# Patient Record
Sex: Female | Born: 1970 | Race: White | Hispanic: No | Marital: Married | Smoking: Never smoker
Health system: Southern US, Community
[De-identification: ages and names within clinical notes are randomized; demographics above are authoritative.]

## PROBLEM LIST (undated history)

## (undated) DIAGNOSIS — T7840XA Allergy, unspecified, initial encounter: Secondary | ICD-10-CM

## (undated) DIAGNOSIS — E785 Hyperlipidemia, unspecified: Secondary | ICD-10-CM

## (undated) DIAGNOSIS — C801 Malignant (primary) neoplasm, unspecified: Secondary | ICD-10-CM

## (undated) HISTORY — DX: Allergy, unspecified, initial encounter: T78.40XA

## (undated) HISTORY — DX: Malignant (primary) neoplasm, unspecified: C80.1

## (undated) HISTORY — DX: Hyperlipidemia, unspecified: E78.5

## (undated) HISTORY — PX: ABDOMINAL HYSTERECTOMY: SHX81

## (undated) HISTORY — PX: APPENDECTOMY: SHX54

---

## 2020-01-09 ENCOUNTER — Ambulatory Visit: Payer: Self-pay | Admitting: Adult Health

## 2020-01-18 ENCOUNTER — Encounter: Payer: Self-pay | Admitting: Adult Health

## 2020-01-18 ENCOUNTER — Ambulatory Visit (INDEPENDENT_AMBULATORY_CARE_PROVIDER_SITE_OTHER): Payer: PRIVATE HEALTH INSURANCE | Admitting: Adult Health

## 2020-01-18 ENCOUNTER — Other Ambulatory Visit: Payer: Self-pay

## 2020-01-18 VITALS — BP 98/69 | HR 75 | Temp 97.7°F | Resp 16 | Ht 67.0 in | Wt 147.2 lb

## 2020-01-18 DIAGNOSIS — Z87448 Personal history of other diseases of urinary system: Secondary | ICD-10-CM | POA: Diagnosis not present

## 2020-01-18 DIAGNOSIS — Z8669 Personal history of other diseases of the nervous system and sense organs: Secondary | ICD-10-CM | POA: Insufficient documentation

## 2020-01-18 DIAGNOSIS — E785 Hyperlipidemia, unspecified: Secondary | ICD-10-CM | POA: Diagnosis not present

## 2020-01-18 DIAGNOSIS — Z111 Encounter for screening for respiratory tuberculosis: Secondary | ICD-10-CM | POA: Insufficient documentation

## 2020-01-18 DIAGNOSIS — Z1389 Encounter for screening for other disorder: Secondary | ICD-10-CM | POA: Diagnosis not present

## 2020-01-18 DIAGNOSIS — R928 Other abnormal and inconclusive findings on diagnostic imaging of breast: Secondary | ICD-10-CM | POA: Insufficient documentation

## 2020-01-18 DIAGNOSIS — R319 Hematuria, unspecified: Secondary | ICD-10-CM

## 2020-01-18 DIAGNOSIS — Z90711 Acquired absence of uterus with remaining cervical stump: Secondary | ICD-10-CM | POA: Insufficient documentation

## 2020-01-18 DIAGNOSIS — Z8541 Personal history of malignant neoplasm of cervix uteri: Secondary | ICD-10-CM | POA: Insufficient documentation

## 2020-01-18 LAB — POCT URINALYSIS DIPSTICK
Bilirubin, UA: NEGATIVE
Glucose, UA: NEGATIVE
Ketones, UA: NEGATIVE
Leukocytes, UA: NEGATIVE
Nitrite, UA: NEGATIVE
Protein, UA: POSITIVE — AB
Spec Grav, UA: 1.01 (ref 1.010–1.025)
Urobilinogen, UA: 0.2 E.U./dL
pH, UA: 5 (ref 5.0–8.0)

## 2020-01-18 NOTE — Progress Notes (Signed)
Positive protein, moderate blood. Do recommend urology and microscopic urine test. She reported history of hematuria chronic.

## 2020-01-18 NOTE — Patient Instructions (Signed)
Health Maintenance, Female Adopting a healthy lifestyle and getting preventive care are important in promoting health and wellness. Ask your health care provider about:  The right schedule for you to have regular tests and exams.  Things you can do on your own to prevent diseases and keep yourself healthy. What should I know about diet, weight, and exercise? Eat a healthy diet   Eat a diet that includes plenty of vegetables, fruits, low-fat dairy products, and lean protein.  Do not eat a lot of foods that are high in solid fats, added sugars, or sodium. Maintain a healthy weight Body mass index (BMI) is used to identify weight problems. It estimates body fat based on height and weight. Your health care provider can help determine your BMI and help you achieve or maintain a healthy weight. Get regular exercise Get regular exercise. This is one of the most important things you can do for your health. Most adults should:  Exercise for at least 150 minutes each week. The exercise should increase your heart rate and make you sweat (moderate-intensity exercise).  Do strengthening exercises at least twice a week. This is in addition to the moderate-intensity exercise.  Spend less time sitting. Even light physical activity can be beneficial. Watch cholesterol and blood lipids Have your blood tested for lipids and cholesterol at 48 years of age, then have this test every 5 years. Have your cholesterol levels checked more often if:  Your lipid or cholesterol levels are high.  You are older than 48 years of age.  You are at high risk for heart disease. What should I know about cancer screening? Depending on your health history and family history, you may need to have cancer screening at various ages. This may include screening for:  Breast cancer.  Cervical cancer.  Colorectal cancer.  Skin cancer.  Lung cancer. What should I know about heart disease, diabetes, and high blood  pressure? Blood pressure and heart disease  High blood pressure causes heart disease and increases the risk of stroke. This is more likely to develop in people who have high blood pressure readings, are of African descent, or are overweight.  Have your blood pressure checked: ? Every 3-5 years if you are 18-39 years of age. ? Every year if you are 40 years old or older. Diabetes Have regular diabetes screenings. This checks your fasting blood sugar level. Have the screening done:  Once every three years after age 40 if you are at a normal weight and have a low risk for diabetes.  More often and at a younger age if you are overweight or have a high risk for diabetes. What should I know about preventing infection? Hepatitis B If you have a higher risk for hepatitis B, you should be screened for this virus. Talk with your health care provider to find out if you are at risk for hepatitis B infection. Hepatitis C Testing is recommended for:  Everyone born from 1945 through 1965.  Anyone with known risk factors for hepatitis C. Sexually transmitted infections (STIs)  Get screened for STIs, including gonorrhea and chlamydia, if: ? You are sexually active and are younger than 48 years of age. ? You are older than 48 years of age and your health care provider tells you that you are at risk for this type of infection. ? Your sexual activity has changed since you were last screened, and you are at increased risk for chlamydia or gonorrhea. Ask your health care provider if   you are at risk.  Ask your health care provider about whether you are at high risk for HIV. Your health care provider may recommend a prescription medicine to help prevent HIV infection. If you choose to take medicine to prevent HIV, you should first get tested for HIV. You should then be tested every 3 months for as long as you are taking the medicine. Pregnancy  If you are about to stop having your period (premenopausal) and  you may become pregnant, seek counseling before you get pregnant.  Take 400 to 800 micrograms (mcg) of folic acid every day if you become pregnant.  Ask for birth control (contraception) if you want to prevent pregnancy. Osteoporosis and menopause Osteoporosis is a disease in which the bones lose minerals and strength with aging. This can result in bone fractures. If you are 65 years old or older, or if you are at risk for osteoporosis and fractures, ask your health care provider if you should:  Be screened for bone loss.  Take a calcium or vitamin D supplement to lower your risk of fractures.  Be given hormone replacement therapy (HRT) to treat symptoms of menopause. Follow these instructions at home: Lifestyle  Do not use any products that contain nicotine or tobacco, such as cigarettes, e-cigarettes, and chewing tobacco. If you need help quitting, ask your health care provider.  Do not use street drugs.  Do not share needles.  Ask your health care provider for help if you need support or information about quitting drugs. Alcohol use  Do not drink alcohol if: ? Your health care provider tells you not to drink. ? You are pregnant, may be pregnant, or are planning to become pregnant.  If you drink alcohol: ? Limit how much you use to 0-1 drink a day. ? Limit intake if you are breastfeeding.  Be aware of how much alcohol is in your drink. In the [place] one drink equals one 12 oz bottle of beer (355 mL), one 5 oz glass of wine (148 mL), or one 1 oz glass of hard liquor (44 mL). General instructions  Schedule regular health, dental, and eye exams.  Stay current with your vaccines.  Tell your health care provider if: ? You often feel depressed. ? You have ever been abused or do not feel safe at home. Summary  Adopting a healthy lifestyle and getting preventive care are important in promoting health and wellness.  Follow your health care provider's instructions about healthy  diet, exercising, and getting tested or screened for diseases.  Follow your health care provider's instructions on monitoring your cholesterol and blood pressure. This information is not intended to replace advice given to you by your health care provider. Make sure you discuss any questions you have with your health care provider. Document Revised: 12/30/2017 Document Reviewed: 12/30/2017 Elsevier Patient Education  2020 Elsevier Inc.  

## 2020-01-18 NOTE — Progress Notes (Signed)
New patient visit   Patient: Marisa Trevino   DOB: 1970/07/19   49 y.o. Female  MRN: [id number] Visit Date: 01/18/2020  Today's healthcare provider: Jairo Ben, FNP   Chief Complaint  Patient presents with  . New Patient (Initial Visit)   Subjective    Marisa Trevino is a 49 y.o. female who presents today as a new patient to establish care.  HPI  Patient reports that she feels well today and has no concerns to address. Patient states that she follows a well balanced diet and is exercising daily. She states due to menopause sleep patterns are poor.   August 24th  2021.  She had labs see media. See abnormal mammogram under media, is over due for repeat mammogram diagnostic declined at this time. Overdue for colonoscopy, declined at  This time. Denies any breast changes, or nipple discharge or skin changes.   History of partial hysterectomy, has had cervical cancer, ovaries remain.   History of migraines for many years per patient controlled with rizatriptan and verapamil. Denies any new or changing symptoms. No recent migraines. No head trauma or injury.   History of microscopic hematuria chronic she reports has been worked up in past and negative, desires no further work up at  This time.   denies any mental health treatment.  Denies any drug or smoking use.  She does use occasional social alcohol.  Working on hyperlipidemia with exercise and dietary changes.   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   denies any homicidal or suicidal ideations or intents now or in the past. She is covid vaccinated.   Patient  denies any fever, body aches,chills, rash, chest pain, shortness of breath, nausea, vomiting, or diarrhea.   Denies dizziness, lightheadedness, pre syncopal or syncopal episodes.    Past Medical History:  Diagnosis Date  . Allergy   . Cancer (HCC)   . Hyperlipidemia    Past Surgical History:  Procedure Laterality Date   . ABDOMINAL HYSTERECTOMY    . APPENDECTOMY     Family Status  Relation Name Status  . Mother  (Not Specified)  . Father  (Not Specified)  . MGF  (Not Specified)   Family History  Problem Relation Age of Onset  . Depression Mother   . Cancer Father   . Heart disease Maternal Grandfather    Social History   Socioeconomic History  . Marital status: Married    Spouse name: Not on file  . Number of children: Not on file  . Years of education: Not on file  . Highest education level: Not on file  Occupational History  . Not on file  Tobacco Use  . Smoking status: Never Smoker  . Smokeless tobacco: Never Used  Substance and Sexual Activity  . Alcohol use: Yes    Alcohol/week: 5.0 standard drinks    Types: 5 Cans of beer per week  . Drug use: Never  . Sexual activity: Not on file  Other Topics Concern  . Not on file  Social History Narrative  . Not on file   Social Determinants of Health   Financial Resource Strain: Not on file  Food Insecurity: Not on file  Transportation Needs: Not on file  Physical Activity: Not on file  Stress: Not on file  Social Connections: Not on file   Outpatient Medications Prior to Visit  Medication Sig  . APPLE CIDER VINEGAR PO Take by mouth 2 (two) times daily.  Marland Kitchen  COLLAGEN PO Take by mouth daily at 6 (six) AM.  . Multiple Vitamin (MULTIVITAMIN) tablet Take 1 tablet by mouth daily.  . NON FORMULARY daily. Metabolism booster  . Omega-3 Fatty Acids (FISH OIL PO) Take by mouth.  Marland Kitchen OVER THE COUNTER MEDICATION 500 mg daily at 6 (six) AM. Fiber  . rizatriptan (MAXALT) 10 MG tablet Take 10 mg by mouth as needed for migraine. May repeat in 2 hours if needed  . Turmeric (QC TUMERIC COMPLEX) 500 MG CAPS Take by mouth.  . verapamil (VERELAN PM) 120 MG 24 hr capsule Take by mouth.  . vitamin E 180 MG (400 UNITS) capsule Take 400 Units by mouth 2 (two) times daily.   No facility-administered medications prior to visit.   Allergies  Allergen  Reactions  . Sulfamethoxazole-Trimethoprim Hives  . Sulfa Antibiotics Hives  . Penicillins Rash    Other reaction(s): Unknown    Immunization History  Administered Date(s) Administered  . Influenza-Unspecified 10/29/2010    Health Maintenance  Topic Date Due  . Hepatitis C Screening  Never done  . COVID-19 Vaccine (1) Never done  . HIV Screening  Never done  . TETANUS/TDAP  Never done  . INFLUENZA VACCINE  08/21/2019  . COLONOSCOPY (Pts 45-35yrs Insurance coverage will need to be confirmed)  01/17/2021 (Originally 06/19/2016)  . PAP SMEAR-Modifier  Discontinued    Patient Care Team: Jamespaul Secrist, Kelby Aline, FNP as PCP - General (Family Medicine)  Review of Systems  Constitutional: Negative.   HENT: Negative.   Respiratory: Negative.   Cardiovascular: Negative.   Gastrointestinal: Negative.   Genitourinary: Positive for hematuria (history of microscopic declines further work up at this time. ). Negative for difficulty urinating.  Musculoskeletal: Negative.   Allergic/Immunologic: Positive for food allergies. Negative for environmental allergies and immunocompromised state.  Neurological: Positive for headaches. Negative for dizziness, tremors, seizures, syncope, facial asymmetry, speech difficulty, weakness, light-headedness and numbness.  Psychiatric/Behavioral: Negative.   All other systems reviewed and are negative.    see media.   Objective    BP 98/69   Pulse 75   Temp 97.7 F (36.5 C) (Oral)   Resp 16   Ht 5\' 7"  (1.702 m)   Wt 147 lb 3.2 oz (66.8 kg)   SpO2 100%   BMI 23.05 kg/m  Physical Exam Vitals and nursing note reviewed.  Constitutional:      General: She is not in acute distress.    Appearance: She is well-developed. She is not diaphoretic.     Interventions: She is not intubated.    Comments: Patient appers well, not sickly. Speaking in complete sentences. Patient moves on and off of exam table and in room without difficulty. Gait is normal in hall  and in room. Patient is oriented to person place time and situation. Patient answers questions appropriately and engages eye contact and verbal dialect with provider.    HENT:     Head: Normocephalic and atraumatic.     Right Ear: External ear normal.     Left Ear: External ear normal.     Nose: Nose normal.     Mouth/Throat:     Pharynx: No oropharyngeal exudate.  Eyes:     General: Lids are normal. No scleral icterus.       Right eye: No discharge.        Left eye: No discharge.     Conjunctiva/sclera: Conjunctivae normal.     Right eye: Right conjunctiva is not injected. No exudate or hemorrhage.  Left eye: Left conjunctiva is not injected. No exudate or hemorrhage.    Pupils: Pupils are equal, round, and reactive to light.  Neck:     Thyroid: No thyroid mass or thyromegaly.     Vascular: Normal carotid pulses. No carotid bruit, hepatojugular reflux or JVD.     Trachea: Trachea and phonation normal. No tracheal tenderness or tracheal deviation.     Meningeal: Brudzinski's sign and Kernig's sign absent.  Cardiovascular:     Rate and Rhythm: Normal rate and regular rhythm.     Pulses: Normal pulses.          Radial pulses are 2+ on the right side and 2+ on the left side.       Dorsalis pedis pulses are 2+ on the right side and 2+ on the left side.       Posterior tibial pulses are 2+ on the right side and 2+ on the left side.     Heart sounds: Normal heart sounds, S1 normal and S2 normal. Heart sounds not distant. No murmur heard. No friction rub. No gallop.   Pulmonary:     Effort: Pulmonary effort is normal. No tachypnea, bradypnea, accessory muscle usage or respiratory distress. She is not intubated.     Breath sounds: Normal breath sounds. No stridor. No wheezing or rales.  Chest:     Chest wall: No tenderness.  Breasts:     Right: No supraclavicular adenopathy.     Left: No supraclavicular adenopathy.    Abdominal:     General: Bowel sounds are normal. There is no  distension or abdominal bruit.     Palpations: Abdomen is soft. There is no shifting dullness, fluid wave, hepatomegaly, splenomegaly, mass or pulsatile mass.     Tenderness: There is no abdominal tenderness. There is no right CVA tenderness, left CVA tenderness, guarding or rebound.     Hernia: No hernia is present.  Genitourinary:    Comments: Declined deferred.  Musculoskeletal:        General: No tenderness or deformity. Normal range of motion.     Cervical back: Full passive range of motion without pain, normal range of motion and neck supple. No edema, erythema or rigidity. No spinous process tenderness or muscular tenderness. Normal range of motion.  Lymphadenopathy:     Head:     Right side of head: No submental, submandibular, tonsillar, preauricular, posterior auricular or occipital adenopathy.     Left side of head: No submental, submandibular, tonsillar, preauricular, posterior auricular or occipital adenopathy.     Cervical: No cervical adenopathy.     Right cervical: No superficial, deep or posterior cervical adenopathy.    Left cervical: No superficial, deep or posterior cervical adenopathy.     Upper Body:     Right upper body: No supraclavicular or pectoral adenopathy.     Left upper body: No supraclavicular or pectoral adenopathy.  Skin:    General: Skin is warm and dry.     Coloration: Skin is not pale.     Findings: No abrasion, bruising, burn, ecchymosis, erythema, lesion, petechiae or rash.     Nails: There is no clubbing.  Neurological:     Mental Status: She is alert and oriented to person, place, and time.     GCS: GCS eye subscore is 4. GCS verbal subscore is 5. GCS motor subscore is 6.     Cranial Nerves: No cranial nerve deficit.     Sensory: No sensory deficit.  Motor: No weakness, tremor, atrophy, abnormal muscle tone or seizure activity.     Coordination: Coordination normal.     Gait: Gait normal.     Deep Tendon Reflexes: Reflexes are normal and  symmetric. Reflexes normal. Babinski sign absent on the right side. Babinski sign absent on the left side.     Reflex Scores:      Tricep reflexes are 2+ on the right side and 2+ on the left side.      Bicep reflexes are 2+ on the right side and 2+ on the left side.      Brachioradialis reflexes are 2+ on the right side and 2+ on the left side.      Patellar reflexes are 2+ on the right side and 2+ on the left side.      Achilles reflexes are 2+ on the right side and 2+ on the left side. Psychiatric:        Mood and Affect: Mood normal.        Speech: Speech normal.        Behavior: Behavior normal.        Thought Content: Thought content normal.        Judgment: Judgment normal.     Depression Screen No flowsheet data found. No results found for any visits on 01/18/20.  Assessment & Plan       Hyperlipidemia, unspecified hyperlipidemia type  History of migraine headaches  Screening for blood or protein in urine - Plan: POCT urinalysis dipstick  History of hematuria  History of partial hysterectomy- has both ovaries.   Screening for tuberculosis - Plan: QuantiFERON-TB Gold Plus  Abnormal mammogram  History of cervical cancer   No orders of the defined types were placed in this encounter.  Recommend mammogram diagnostic per last mammogram see media, and colonoscopy. Patient declines and will return to office at later date to discuss she reports.  Risks versus benefits discussed. Patient verbalized understanding of all instructions given and denies any further questions at this time.   Red Flags discussed. The patient was given clear instructions to go to ER or return to medical center if any red flags develop, symptoms do not improve, worsen or new problems develop. They verbalized understanding. Urology advised for hematuria she declines at this time. Risk versus benefits understood by patient.   Work form filled out and copy kept to scan to chart as well as labs to  scan to chart.  Orders Placed This Encounter  Procedures  . QuantiFERON-TB Gold Plus  . POCT urinalysis dipstick   Red Flags discussed. The patient was given clear instructions to go to ER or return to medical center if any red flags develop, symptoms do not improve, worsen or new problems develop. They verbalized understanding.  The patient is advised to begin progressive daily aerobic exercise program, follow a low fat, low cholesterol diet, reduce salt in diet and cooking, reduce exposure to stress, improve dietary compliance, continue current medications, continue current healthy lifestyle patterns and return for routine annual checkups.  Return in about 3 months (around 04/17/2020), or if symptoms worsen or fail to improve, for at any time for any worsening symptoms, Go to Emergency room/ urgent care if worse.     The entirety of the information documented in the History of Present Illness, Review of Systems and Physical Exam were personally obtained by me. Portions of this information were initially documented by the CMA and reviewed by me for thoroughness and accuracy.  Marcille Buffy, [place] [phone] (phone) [phone] (fax)  [place]

## 2020-01-19 LAB — URINALYSIS, MICROSCOPIC ONLY
Bacteria, UA: NONE SEEN
Casts: NONE SEEN /lpf
WBC, UA: NONE SEEN /hpf (ref 0–5)

## 2020-01-19 NOTE — Progress Notes (Signed)
Hematuria in microscopic , do recommend urologist referral since she reports this has been chronic and she has a history of cervical cancer.

## 2020-01-20 LAB — QUANTIFERON-TB GOLD PLUS
QuantiFERON Mitogen Value: >10 IU/mL
QuantiFERON Nil Value: 0.57 IU/mL
QuantiFERON TB1 Ag Value: 0.87 IU/mL
QuantiFERON TB2 Ag Value: 0.44 IU/mL
QuantiFERON-TB Gold Plus: NEGATIVE

## 2020-02-17 ENCOUNTER — Other Ambulatory Visit: Payer: Self-pay | Admitting: Adult Health

## 2020-02-17 ENCOUNTER — Encounter: Payer: Self-pay | Admitting: Adult Health

## 2020-02-17 MED ORDER — VERAPAMIL HCL ER 120 MG PO CP24
120.0000 mg | ORAL_CAPSULE | Freq: Every day | ORAL | 1 refills | Status: DC
Start: 2020-02-17 — End: 2020-02-22

## 2020-02-17 NOTE — Progress Notes (Signed)
Meds ordered this encounter  Medications  . verapamil (VERELAN PM) 120 MG 24 hr capsule    Sig: Take 1 capsule (120 mg total) by mouth at bedtime.    Dispense:  90 capsule    Refill:  1

## 2020-02-21 ENCOUNTER — Encounter: Payer: Self-pay | Admitting: Adult Health

## 2020-02-22 ENCOUNTER — Other Ambulatory Visit: Payer: Self-pay | Admitting: Adult Health

## 2020-02-22 DIAGNOSIS — Z8669 Personal history of other diseases of the nervous system and sense organs: Secondary | ICD-10-CM

## 2020-02-22 MED ORDER — VERAPAMIL HCL ER 120 MG PO TBCR: 120.0000 mg | EXTENDED_RELEASE_TABLET | Freq: Every day | ORAL | 1 refills | Status: DC

## 2020-02-22 NOTE — Progress Notes (Signed)
Meds ordered this encounter  Medications  . verapamil (CALAN-SR) 120 MG CR tablet    Sig: Take 1 tablet (120 mg total) by mouth at bedtime.    Dispense:  90 tablet    Refill:  1    Please cancel capsules previously sent not covered per patient.    Medications Discontinued During This Encounter  Medication Reason  . verapamil (VERELAN PM) 120 MG 24 hr capsule Not covered by the pt's insurance

## 2020-05-08 ENCOUNTER — Telehealth: Payer: Self-pay

## 2020-05-08 ENCOUNTER — Other Ambulatory Visit: Payer: Self-pay | Admitting: Adult Health

## 2020-05-08 DIAGNOSIS — R928 Other abnormal and inconclusive findings on diagnostic imaging of breast: Secondary | ICD-10-CM

## 2020-05-08 NOTE — Progress Notes (Signed)
Orders Placed This Encounter  Procedures  . MM 3D SCREEN BREAST BILATERAL  . US BREAST LTD UNI LEFT INC AXILLA  . US BREAST LTD UNI RIGHT INC AXILLA

## 2020-05-08 NOTE — Telephone Encounter (Signed)
Copied from [place] [id number]. Topic: General - Other >> May 08, 2020 12:23 PM Antonieta Iba C wrote: Reason for CRM: pt called in for assistance. Pt says that she will be due to have her yearly mammogram in July. Pt would like a referral. Pt says that she is new to the area and is not familiar with who provider would suggest.    Please assist.

## 2020-05-08 NOTE — Telephone Encounter (Signed)
Orders Placed This Encounter  Procedures   MM 3D SCREEN BREAST BILATERAL   US BREAST LTD UNI LEFT INC AXILLA   US BREAST LTD UNI RIGHT INC AXILLA  Previous mammogram under media, provider ordered the above test at [facility].   [facility] [place], [place] [address]  Main: [phone]

## 2020-05-17 ENCOUNTER — Inpatient Hospital Stay
Admission: RE | Admit: 2020-05-17 | Discharge: 2020-05-17 | Disposition: A | Discharge status: Home / Self Care | Payer: Self-pay | Source: Ambulatory Visit | Attending: *Deleted | Admitting: *Deleted

## 2020-05-17 ENCOUNTER — Other Ambulatory Visit: Payer: Self-pay | Admitting: *Deleted

## 2020-05-17 DIAGNOSIS — Z1231 Encounter for screening mammogram for malignant neoplasm of breast: Secondary | ICD-10-CM

## 2020-05-21 ENCOUNTER — Telehealth: Payer: Self-pay | Admitting: Adult Health

## 2020-05-21 NOTE — Telephone Encounter (Signed)
Patient called to request another referral for the [facility].  She stated that there was a referral put in by Dr. Claudina Lick but because she is no longer at the practice, the breast center said they would need a new referral from a current doctor there.  Please advise and call patient to discuss.  CB# [phone]

## 2020-05-21 NOTE — Telephone Encounter (Signed)
Please order screening mammogram

## 2020-05-23 ENCOUNTER — Other Ambulatory Visit: Payer: Self-pay | Admitting: *Deleted

## 2020-05-23 DIAGNOSIS — R928 Other abnormal and inconclusive findings on diagnostic imaging of breast: Secondary | ICD-10-CM

## 2020-05-23 NOTE — Telephone Encounter (Signed)
Pt stated she is having an issue so she would like to request that the referral be placed as soon as possible.

## 2020-05-23 NOTE — Progress Notes (Signed)
Re-ordered mammogram.   

## 2020-05-24 ENCOUNTER — Other Ambulatory Visit: Payer: Self-pay | Admitting: Family Medicine

## 2020-05-24 DIAGNOSIS — R921 Mammographic calcification found on diagnostic imaging of breast: Secondary | ICD-10-CM

## 2020-05-29 ENCOUNTER — Other Ambulatory Visit: Payer: Self-pay | Admitting: Family Medicine

## 2020-05-29 ENCOUNTER — Other Ambulatory Visit: Payer: Self-pay

## 2020-05-29 ENCOUNTER — Ambulatory Visit
Admission: RE | Admit: 2020-05-29 | Discharge: 2020-05-29 | Disposition: A | Discharge status: Home / Self Care | Payer: Managed Care, Other (non HMO) | Source: Ambulatory Visit | Attending: Family Medicine | Admitting: Family Medicine

## 2020-05-29 ENCOUNTER — Ambulatory Visit: Admission: RE | Admit: 2020-05-29 | Payer: Managed Care, Other (non HMO) | Source: Ambulatory Visit

## 2020-05-29 DIAGNOSIS — R921 Mammographic calcification found on diagnostic imaging of breast: Secondary | ICD-10-CM | POA: Diagnosis present

## 2020-05-29 DIAGNOSIS — N6310 Unspecified lump in the right breast, unspecified quadrant: Secondary | ICD-10-CM

## 2020-05-29 DIAGNOSIS — R928 Other abnormal and inconclusive findings on diagnostic imaging of breast: Secondary | ICD-10-CM

## 2020-05-30 ENCOUNTER — Other Ambulatory Visit: Payer: Self-pay | Admitting: Family Medicine

## 2020-05-30 DIAGNOSIS — N6310 Unspecified lump in the right breast, unspecified quadrant: Secondary | ICD-10-CM

## 2020-05-30 DIAGNOSIS — R928 Other abnormal and inconclusive findings on diagnostic imaging of breast: Secondary | ICD-10-CM

## 2020-06-05 ENCOUNTER — Other Ambulatory Visit: Payer: PRIVATE HEALTH INSURANCE

## 2020-06-14 ENCOUNTER — Ambulatory Visit
Admission: RE | Admit: 2020-06-14 | Discharge: 2020-06-14 | Disposition: A | Discharge status: Home / Self Care | Payer: 59 | Source: Ambulatory Visit | Attending: Family Medicine | Admitting: Family Medicine

## 2020-06-14 ENCOUNTER — Other Ambulatory Visit: Payer: Self-pay

## 2020-06-14 DIAGNOSIS — R928 Other abnormal and inconclusive findings on diagnostic imaging of breast: Secondary | ICD-10-CM | POA: Diagnosis not present

## 2020-06-14 DIAGNOSIS — N6310 Unspecified lump in the right breast, unspecified quadrant: Secondary | ICD-10-CM

## 2020-06-14 HISTORY — PX: BREAST CYST ASPIRATION: SHX578

## 2020-07-25 ENCOUNTER — Telehealth: Payer: Self-pay

## 2020-07-25 NOTE — Telephone Encounter (Signed)
Copied from [place] [id number]. Topic: General - Other >> Jul 25, 2020 10:23 AM Marisa Trevino A wrote: Reason for CRM: Patient would like to have orders for labs submitted so that they can come in to have their blood drawn before their physical  Patient would like to discuss their lab results at their physical   Please contact to further advise

## 2020-07-25 NOTE — Telephone Encounter (Signed)
Way to early to get labs now. Probably should wait until the first of October.

## 2020-07-25 NOTE — Telephone Encounter (Signed)
Patient scheduled for 11/09/2020

## 2020-09-04 ENCOUNTER — Encounter: Payer: PRIVATE HEALTH INSURANCE | Admitting: Family Medicine

## 2020-09-21 ENCOUNTER — Other Ambulatory Visit: Payer: Self-pay | Admitting: Adult Health

## 2020-09-21 DIAGNOSIS — Z8669 Personal history of other diseases of the nervous system and sense organs: Secondary | ICD-10-CM

## 2020-11-05 ENCOUNTER — Telehealth: Payer: Self-pay

## 2020-11-05 NOTE — Telephone Encounter (Signed)
Copied from [place] [id number]. Topic: General - Inquiry >> Nov 05, 2020  9:58 AM Scherrie Gerlach wrote: Reason for CRM: pt states her insurance will pay for ROUTINE blood work.  Pt was to call in and let you know.  She would like to come in Wed morning for her labs if you could put the orders in.

## 2020-11-05 NOTE — Telephone Encounter (Signed)
Patient has a CPE scheduled 11/09/20 please advise if you want labs ordered prior to appt? KW

## 2020-11-06 ENCOUNTER — Other Ambulatory Visit: Payer: Self-pay | Admitting: Family Medicine

## 2020-11-06 DIAGNOSIS — Z131 Encounter for screening for diabetes mellitus: Secondary | ICD-10-CM

## 2020-11-06 DIAGNOSIS — Z13220 Encounter for screening for lipoid disorders: Secondary | ICD-10-CM | POA: Insufficient documentation

## 2020-11-06 DIAGNOSIS — N029 Recurrent and persistent hematuria with unspecified morphologic changes: Secondary | ICD-10-CM

## 2020-11-06 DIAGNOSIS — Z136 Encounter for screening for cardiovascular disorders: Secondary | ICD-10-CM | POA: Insufficient documentation

## 2020-11-06 DIAGNOSIS — R6889 Other general symptoms and signs: Secondary | ICD-10-CM | POA: Insufficient documentation

## 2020-11-06 DIAGNOSIS — G479 Sleep disorder, unspecified: Secondary | ICD-10-CM | POA: Insufficient documentation

## 2020-11-06 DIAGNOSIS — Z114 Encounter for screening for human immunodeficiency virus [HIV]: Secondary | ICD-10-CM

## 2020-11-06 DIAGNOSIS — Z1159 Encounter for screening for other viral diseases: Secondary | ICD-10-CM | POA: Insufficient documentation

## 2020-11-08 LAB — CBC WITH DIFFERENTIAL/PLATELET
Basophils Absolute: 0.1 10*3/uL (ref 0.0–0.2)
Basos: 1 %
EOS (ABSOLUTE): 0.1 10*3/uL (ref 0.0–0.4)
Eos: 2 %
Hematocrit: 38.6 % (ref 34.0–46.6)
Hemoglobin: 13.2 g/dL (ref 11.1–15.9)
Immature Grans (Abs): 0 10*3/uL (ref 0.0–0.1)
Immature Granulocytes: 0 %
Lymphocytes Absolute: 2 10*3/uL (ref 0.7–3.1)
Lymphs: 38 %
MCH: 31.7 pg (ref 26.6–33.0)
MCHC: 34.2 g/dL (ref 31.5–35.7)
MCV: 93 fL (ref 79–97)
Monocytes Absolute: 0.4 10*3/uL (ref 0.1–0.9)
Monocytes: 7 %
Neutrophils Absolute: 2.7 10*3/uL (ref 1.4–7.0)
Neutrophils: 52 %
Platelets: 246 10*3/uL (ref 150–450)
RBC: 4.16 x10E6/uL (ref 3.77–5.28)
RDW: 13 % (ref 11.7–15.4)
WBC: 5.2 10*3/uL (ref 3.4–10.8)

## 2020-11-08 LAB — COMPREHENSIVE METABOLIC PANEL
ALT: 19 IU/L (ref 0–32)
AST: 24 IU/L (ref 0–40)
Albumin/Globulin Ratio: 2.3 — ABNORMAL HIGH (ref 1.2–2.2)
Albumin: 5 g/dL — ABNORMAL HIGH (ref 3.8–4.8)
Alkaline Phosphatase: 69 IU/L (ref 44–121)
BUN/Creatinine Ratio: 34 — ABNORMAL HIGH (ref 9–23)
BUN: 26 mg/dL — ABNORMAL HIGH (ref 6–24)
Bilirubin Total: 0.4 mg/dL (ref 0.0–1.2)
CO2: 24 mmol/L (ref 20–29)
Calcium: 10 mg/dL (ref 8.7–10.2)
Chloride: 99 mmol/L (ref 96–106)
Creatinine, Ser: 0.76 mg/dL (ref 0.57–1.00)
Globulin, Total: 2.2 g/dL (ref 1.5–4.5)
Glucose: 89 mg/dL (ref 70–99)
Potassium: 4.3 mmol/L (ref 3.5–5.2)
Sodium: 138 mmol/L (ref 134–144)
Total Protein: 7.2 g/dL (ref 6.0–8.5)
eGFR: 96 mL/min/{1.73_m2} (ref >59)

## 2020-11-08 LAB — LIPID PANEL
Chol/HDL Ratio: 3.5 ratio (ref 0.0–4.4)
Cholesterol, Total: 262 mg/dL — ABNORMAL HIGH (ref 100–199)
HDL: 75 mg/dL (ref >39)
LDL Chol Calc (NIH): 178 mg/dL — ABNORMAL HIGH (ref 0–99)
Triglycerides: 57 mg/dL (ref 0–149)
VLDL Cholesterol Cal: 9 mg/dL (ref 5–40)

## 2020-11-08 LAB — HEMOGLOBIN A1C
Est. average glucose Bld gHb Est-mCnc: 105 mg/dL
Hgb A1c MFr Bld: 5.3 % (ref 4.8–5.6)

## 2020-11-08 LAB — HIV ANTIBODY (ROUTINE TESTING W REFLEX): HIV Screen 4th Generation wRfx: NONREACTIVE

## 2020-11-08 LAB — TSH: TSH: 0.68 u[IU]/mL (ref 0.450–4.500)

## 2020-11-08 LAB — HEPATITIS C ANTIBODY: Hep C Virus Ab: <0.1 s/co ratio (ref 0.0–0.9)

## 2020-11-08 LAB — T4, FREE: Free T4: 1.13 ng/dL (ref 0.82–1.77)

## 2020-11-09 ENCOUNTER — Encounter: Payer: PRIVATE HEALTH INSURANCE | Admitting: Family Medicine

## 2020-11-09 ENCOUNTER — Other Ambulatory Visit: Payer: Self-pay

## 2020-11-09 ENCOUNTER — Ambulatory Visit (INDEPENDENT_AMBULATORY_CARE_PROVIDER_SITE_OTHER): Payer: PRIVATE HEALTH INSURANCE | Admitting: Family Medicine

## 2020-11-09 ENCOUNTER — Encounter: Payer: Self-pay | Admitting: Family Medicine

## 2020-11-09 VITALS — BP 96/64 | HR 67 | Temp 98.5°F | Ht 67.0 in | Wt 144.5 lb

## 2020-11-09 DIAGNOSIS — Z23 Encounter for immunization: Secondary | ICD-10-CM | POA: Diagnosis not present

## 2020-11-09 DIAGNOSIS — G43911 Migraine, unspecified, intractable, with status migrainosus: Secondary | ICD-10-CM | POA: Diagnosis not present

## 2020-11-09 DIAGNOSIS — Z91018 Allergy to other foods: Secondary | ICD-10-CM

## 2020-11-09 DIAGNOSIS — J301 Allergic rhinitis due to pollen: Secondary | ICD-10-CM

## 2020-11-09 DIAGNOSIS — E785 Hyperlipidemia, unspecified: Secondary | ICD-10-CM

## 2020-11-09 DIAGNOSIS — Z0000 Encounter for general adult medical examination without abnormal findings: Secondary | ICD-10-CM

## 2020-11-09 DIAGNOSIS — E7800 Pure hypercholesterolemia, unspecified: Secondary | ICD-10-CM

## 2020-11-09 NOTE — Progress Notes (Signed)
0    Complete physical exam   Patient: Marisa Trevino   DOB: Sep 17, 1970   50 y.o. Female  MRN: [id number] Visit Date: 11/09/2020  Today's healthcare provider: Gwyneth Sprout, FNP   Chief Complaint  Patient presents with   Annual Exam   Subjective    Marisa Trevino is a 50 y.o. female who presents today for a complete physical exam.  She reports consuming a general and clean  diet. The patient does not participate in regular exercise at present. She generally feels well. She reports sleeping well.  HPI   Past Medical History:  Diagnosis Date   Allergy    Cancer ([place])    Hyperlipidemia    Past Surgical History:  Procedure Laterality Date   ABDOMINAL HYSTERECTOMY     APPENDECTOMY     BREAST CYST ASPIRATION Right 06/14/2020   5:30 retroaerolar   BREAST CYST ASPIRATION Right 06/14/2020   6:00 retroaerolar   Social History   Socioeconomic History   Marital status: Married    Spouse name: Not on file   Number of children: Not on file   Years of education: Not on file   Highest education level: Not on file  Occupational History   Not on file  Tobacco Use   Smoking status: Never   Smokeless tobacco: Never  Substance and Sexual Activity   Alcohol use: Yes    Alcohol/week: 5.0 standard drinks    Types: 5 Cans of beer per week   Drug use: Never   Sexual activity: Not on file  Other Topics Concern   Not on file  Social History Narrative   Not on file   Social Determinants of Health   Financial Resource Strain: Not on file  Food Insecurity: Not on file  Transportation Needs: Not on file  Physical Activity: Not on file  Stress: Not on file  Social Connections: Not on file  Intimate Partner Violence: Not on file   Family Status  Relation Name Status   Mother  (Not Specified)   Father  (Not Specified)   MGF  (Not Specified)   Neg Hx  (Not Specified)   Family History  Problem Relation Age of Onset   Depression Mother    Cancer Father    Heart disease  Maternal Grandfather    Breast cancer Neg Hx    Allergies  Allergen Reactions   Sulfamethoxazole-Trimethoprim Hives   Sulfa Antibiotics Hives   Penicillins Rash    Other reaction(s): Unknown    Patient Care Team: Gwyneth Sprout, FNP as PCP - General (Family Medicine)   Medications: Outpatient Medications Prior to Visit  Medication Sig   APPLE CIDER VINEGAR PO Take by mouth 2 (two) times daily.   COLLAGEN PO Take by mouth daily at 6 (six) AM.   Multiple Vitamin (MULTIVITAMIN) tablet Take 1 tablet by mouth daily.   NON FORMULARY daily. Metabolism booster   Omega-3 Fatty Acids (FISH OIL PO) Take by mouth.   OVER THE COUNTER MEDICATION 500 mg daily at 6 (six) AM. Fiber   rizatriptan (MAXALT) 10 MG tablet Take 10 mg by mouth as needed for migraine. May repeat in 2 hours if needed   Turmeric 500 MG CAPS Take by mouth.   vitamin E 180 MG (400 UNITS) capsule Take 400 Units by mouth 2 (two) times daily.   [DISCONTINUED] verapamil (CALAN-SR) 120 MG CR tablet TAKE 1 TABLET BY MOUTH AT BEDTIME   No facility-administered medications prior to visit.  Review of Systems  HENT:  Positive for ear pain and voice change.   Allergic/Immunologic: Positive for environmental allergies and food allergies.  All other systems reviewed and are negative.  Is taking OTC medication to help with allergy symptoms Has stopped verapamil which she was taking for migraine prevention given dairy allergy    Objective    BP 96/64 (BP Location: Left Arm, Patient Position: Sitting, Cuff Size: Normal)   Pulse 67   Temp 98.5 F (36.9 C) (Oral)   Ht 5\' 7"  (1.702 m)   Wt 144 lb 8 oz (65.5 kg)   BMI 22.63 kg/m    Physical Exam Vitals and nursing note reviewed.  Constitutional:      General: She is awake. She is not in acute distress.    Appearance: Normal appearance. She is well-developed, well-groomed and normal weight. She is not ill-appearing, toxic-appearing or diaphoretic.  HENT:     Head:  Normocephalic and atraumatic.     Jaw: There is normal jaw occlusion. No trismus, tenderness, swelling or pain on movement.     Right Ear: Hearing, tympanic membrane, ear canal and external ear normal. There is no impacted cerumen.     Left Ear: Hearing, tympanic membrane, ear canal and external ear normal. There is no impacted cerumen.     Nose: Nose normal. No congestion or rhinorrhea.     Right Turbinates: Not enlarged, swollen or pale.     Left Turbinates: Not enlarged, swollen or pale.     Right Sinus: No maxillary sinus tenderness or frontal sinus tenderness.     Left Sinus: No maxillary sinus tenderness or frontal sinus tenderness.     Mouth/Throat:     Lips: Pink.     Mouth: Mucous membranes are moist. No injury.     Tongue: No lesions.     Pharynx: Oropharynx is clear. Uvula midline. No pharyngeal swelling, oropharyngeal exudate, posterior oropharyngeal erythema or uvula swelling.     Tonsils: No tonsillar exudate or tonsillar abscesses.  Eyes:     General: Lids are normal. Lids are everted, no foreign bodies appreciated. Vision grossly intact. Gaze aligned appropriately. No allergic shiner or visual field deficit.       Right eye: No discharge.        Left eye: No discharge.     Extraocular Movements: Extraocular movements intact.     Conjunctiva/sclera: Conjunctivae normal.     Right eye: Right conjunctiva is not injected. No exudate.    Left eye: Left conjunctiva is not injected. No exudate.    Pupils: Pupils are equal, round, and reactive to light.  Neck:     Thyroid: No thyroid mass, thyromegaly or thyroid tenderness.     Vascular: No carotid bruit.     Trachea: Trachea normal.  Cardiovascular:     Rate and Rhythm: Normal rate and regular rhythm.     Pulses: Normal pulses.          Carotid pulses are 2+ on the right side and 2+ on the left side.      Radial pulses are 2+ on the right side and 2+ on the left side.       Dorsalis pedis pulses are 2+ on the right side and  2+ on the left side.       Posterior tibial pulses are 2+ on the right side and 2+ on the left side.     Heart sounds: Normal heart sounds, S1 normal and S2 normal. No murmur heard.  No friction rub. No gallop.  Pulmonary:     Effort: Pulmonary effort is normal. No respiratory distress.     Breath sounds: Normal breath sounds and air entry. No stridor. No wheezing, rhonchi or rales.  Chest:     Chest wall: No tenderness.     Comments: Breast exam deferred; discussed 'know your lemons' campaign and self exam Abdominal:     General: Abdomen is flat. Bowel sounds are normal. There is no distension.     Palpations: Abdomen is soft. There is no mass.     Tenderness: There is no abdominal tenderness. There is no right CVA tenderness, left CVA tenderness, guarding or rebound.     Hernia: No hernia is present.  Genitourinary:    Comments: Exam deferred; denies complaints Musculoskeletal:        General: No swelling, tenderness, deformity or signs of injury. Normal range of motion.     Cervical back: Full passive range of motion without pain, normal range of motion and neck supple. No edema, rigidity or tenderness. No muscular tenderness.     Right lower leg: No edema.     Left lower leg: No edema.  Lymphadenopathy:     Cervical: No cervical adenopathy.     Right cervical: No superficial, deep or posterior cervical adenopathy.    Left cervical: No superficial, deep or posterior cervical adenopathy.  Skin:    General: Skin is warm and dry.     Capillary Refill: Capillary refill takes less than 2 seconds.     Coloration: Skin is not jaundiced or pale.     Findings: No bruising, erythema, lesion or rash.  Neurological:     General: No focal deficit present.     Mental Status: She is alert and oriented to person, place, and time. Mental status is at baseline.     GCS: GCS eye subscore is 4. GCS verbal subscore is 5. GCS motor subscore is 6.     Sensory: Sensation is intact. No sensory deficit.      Motor: Motor function is intact. No weakness.     Coordination: Coordination is intact. Coordination normal.     Gait: Gait is intact. Gait normal.  Psychiatric:        Attention and Perception: Attention and perception normal.        Mood and Affect: Mood and affect normal.        Speech: Speech normal.        Behavior: Behavior normal. Behavior is cooperative.        Thought Content: Thought content normal.        Cognition and Memory: Cognition and memory normal.        Judgment: Judgment normal.    Last depression screening scores PHQ 2/9 Scores 11/09/2020  PHQ - 2 Score 0  PHQ- 9 Score 2   Last fall risk screening Fall Risk  11/09/2020  Falls in the past year? 0  Number falls in past yr: 0  Injury with Fall? 0  Risk for fall due to : No Fall Risks   Last Audit-C alcohol use screening Alcohol Use Disorder Test (AUDIT) 11/09/2020  1. How often do you have a drink containing alcohol? 3  2. How many drinks containing alcohol do you have on a typical day when you are drinking? 0  3. How often do you have six or more drinks on one occasion? 0  AUDIT-C Score 3   A score of 3 or more in women,  and 4 or more in men indicates increased risk for alcohol abuse, EXCEPT if all of the points are from question 1   No results found for any visits on 11/09/20.  Assessment & Plan    Routine Health Maintenance and Physical Exam  Exercise Activities and Dietary recommendations  Goals   None     Immunization History  Administered Date(s) Administered   Influenza-Unspecified 10/29/2010   PFIZER Comirnaty(Gray Top)Covid-19 Tri-Sucrose Vaccine 03/09/2020   PFIZER(Purple Top)SARS-COV-2 Vaccination 03/29/2019, 04/19/2019   Tdap 10/13/2008    Health Maintenance  Topic Date Due   TETANUS/TDAP  10/14/2018   COVID-19 Vaccine (4 - Booster for Pfizer series) 05/04/2020   INFLUENZA VACCINE  11/23/2020 (Originally 08/20/2020)   COLONOSCOPY (Pts 45-32yrs Insurance coverage will need to  be confirmed)  01/17/2021 (Originally 06/19/2016)   Hepatitis C Screening  Completed   HIV Screening  Completed   Pneumococcal Vaccine 79-78 Years old  Aged Out   HPV VACCINES  Aged Out   PAP SMEAR-Modifier  Discontinued    Discussed health benefits of physical activity, and encouraged her to engage in regular exercise appropriate for her age and condition.  Problem List Items Addressed This Visit       Cardiovascular and Mediastinum   Intractable migraine with status migrainosus    Has changed diet d/t food allergies Has stopped preventative medication  Has PRN medication for occurrences as needed        Respiratory   Seasonal allergic rhinitis due to pollen    Acute, non chronic concerns Using claritin, recommend adding flonase Reports addition of another medication for 'hay fever' Continue to monitor s/s        Other   Hyperlipidemia    ASCVD risk reviewed Continue to recommend lower fat diet and exercise      Annual physical exam - Primary    Due for eye exam utd on dental cleaning Things to do to keep yourself healthy  - Exercise at least 30-45 minutes a day, 3-4 days a week.  - Eat a low-fat diet with lots of fruits and vegetables, up to 7-9 servings per day.  - Seatbelts can save your life. Wear them always.  - Smoke detectors on every level of your home, check batteries every year.  - Eye Doctor - have an eye exam every 1-2 years  - Safe sex - if you may be exposed to STDs, use a condom.  - Alcohol -  If you drink, do it moderately, less than 2 drinks per day.  - [place]. Choose someone to speak for you if you are not able.  - Depression is common in our stressful world.If you're feeling down or losing interest in things you normally enjoy, please come in for a visit.  - Violence - If anyone is threatening or hurting you, please call immediately.        Multiple food allergies    Dairy, milk      Elevated cholesterol    Return  in about 1 year (around 11/09/2021) for annual examination.     Vonna Kotyk, FNP, have reviewed all documentation for this visit. The documentation on 11/09/20 for the exam, diagnosis, procedures, and orders are all accurate and complete.    Gwyneth Sprout, [place] [phone] (phone) [phone] (fax)  [place]

## 2020-11-09 NOTE — Assessment & Plan Note (Signed)
Has changed diet d/t food allergies Has stopped preventative medication  Has PRN medication for occurrences as needed

## 2020-11-09 NOTE — Addendum Note (Signed)
Addended by: Barnie Mort on: 11/09/2020 04:52 PM   Modules accepted: Orders

## 2020-11-09 NOTE — Assessment & Plan Note (Signed)
Due for eye exam utd on dental cleaning Things to do to keep yourself healthy  - Exercise at least 30-45 minutes a day, 3-4 days a week.  - Eat a low-fat diet with lots of fruits and vegetables, up to 7-9 servings per day.  - Seatbelts can save your life. Wear them always.  - Smoke detectors on every level of your home, check batteries every year.  - Eye Doctor - have an eye exam every 1-2 years  - Safe sex - if you may be exposed to STDs, use a condom.  - Alcohol -  If you drink, do it moderately, less than 2 drinks per day.  - [place]. Choose someone to speak for you if you are not able.  - Depression is common in our stressful world.If you're feeling down or losing interest in things you normally enjoy, please come in for a visit.  - Violence - If anyone is threatening or hurting you, please call immediately.

## 2020-11-09 NOTE — Assessment & Plan Note (Signed)
ASCVD risk reviewed Continue to recommend lower fat diet and exercise

## 2020-11-09 NOTE — Assessment & Plan Note (Signed)
Dairy, milk

## 2020-11-09 NOTE — Assessment & Plan Note (Signed)
Acute, non chronic concerns Using claritin, recommend adding flonase Reports addition of another medication for 'hay fever' Continue to monitor s/s

## 2021-12-26 ENCOUNTER — Other Ambulatory Visit: Payer: Self-pay | Admitting: Family Medicine

## 2021-12-26 DIAGNOSIS — R928 Other abnormal and inconclusive findings on diagnostic imaging of breast: Secondary | ICD-10-CM

## 2022-01-17 ENCOUNTER — Other Ambulatory Visit: Payer: PRIVATE HEALTH INSURANCE

## 2022-01-24 ENCOUNTER — Other Ambulatory Visit: Payer: PRIVATE HEALTH INSURANCE

## 2022-01-24 ENCOUNTER — Inpatient Hospital Stay: Admission: RE | Admit: 2022-01-24 | Payer: PRIVATE HEALTH INSURANCE | Source: Ambulatory Visit

## 2022-02-14 ENCOUNTER — Ambulatory Visit
Admission: RE | Admit: 2022-02-14 | Discharge: 2022-02-14 | Disposition: A | Discharge status: Home / Self Care | Payer: BC Managed Care – PPO | Source: Ambulatory Visit | Attending: Family Medicine | Admitting: Family Medicine

## 2022-02-14 DIAGNOSIS — R928 Other abnormal and inconclusive findings on diagnostic imaging of breast: Secondary | ICD-10-CM | POA: Diagnosis present

## 2022-02-17 ENCOUNTER — Other Ambulatory Visit: Payer: Self-pay | Admitting: Student

## 2022-02-17 DIAGNOSIS — R928 Other abnormal and inconclusive findings on diagnostic imaging of breast: Secondary | ICD-10-CM

## 2022-03-26 ENCOUNTER — Encounter: Payer: Self-pay | Admitting: Family Medicine

## 2022-03-27 ENCOUNTER — Ambulatory Visit
Admission: RE | Admit: 2022-03-27 | Discharge: 2022-03-27 | Disposition: A | Discharge status: Home / Self Care | Payer: BC Managed Care – PPO | Source: Ambulatory Visit | Attending: Student | Admitting: Student

## 2022-03-27 DIAGNOSIS — R928 Other abnormal and inconclusive findings on diagnostic imaging of breast: Secondary | ICD-10-CM | POA: Insufficient documentation

## 2022-03-27 HISTORY — PX: BREAST BIOPSY: SHX20

## 2022-03-27 MED ORDER — LIDOCAINE HCL (PF) 1 % IJ SOLN
5.0000 mL | Freq: Once | INTRAMUSCULAR | Status: AC
  Administered 2022-03-27: 5 mL

## 2022-03-27 MED ORDER — LIDOCAINE-EPINEPHRINE 1 %-1:100000 IJ SOLN
20.0000 mL | Freq: Once | INTRAMUSCULAR | Status: AC
  Administered 2022-03-27: 20 mL

## 2022-03-28 ENCOUNTER — Encounter: Payer: Self-pay | Admitting: *Deleted

## 2022-03-28 LAB — SURGICAL PATHOLOGY

## 2022-03-28 NOTE — Progress Notes (Signed)
Referral recieved from [facility] Radiology for benign breast mass.  She would like to be referred to [facility].  Their office is closed on Friday afternoons, I will call on Monday morning to get an appt.

## 2022-03-31 ENCOUNTER — Encounter: Payer: Self-pay | Admitting: *Deleted

## 2022-03-31 NOTE — Progress Notes (Signed)
Appt. Scheduled with Dr. Peyton Najjar for Friday April 5th at 8:00 am.

## 2022-05-01 IMAGING — MG MM DIGITAL DIAGNOSTIC UNILAT*R* W/ TOMO W/ CAD
8 series · 8 of 24 positions shown · non-contrast
Comparison: Previous exam(s).

CLINICAL DATA: Patient is post ultrasound-guided aspiration of 2
complicated cyst over the [DATE] and 6 o'clock position of the right
retroareolar region. Recent diagnostic workup suggested possible
distortion over the lower central right breast in the anterior third
and suggested post aspiration mammogram for reassessment.

EXAM:
DIGITAL DIAGNOSTIC UNILATERAL RIGHT MAMMOGRAM WITH TOMOSYNTHESIS AND
CAD
TECHNIQUE: Right digital diagnostic mammography and breast tomosynthesis was
performed. The images were evaluated with computer-aided detection.

[R CC synth-2D (1 of 2)]
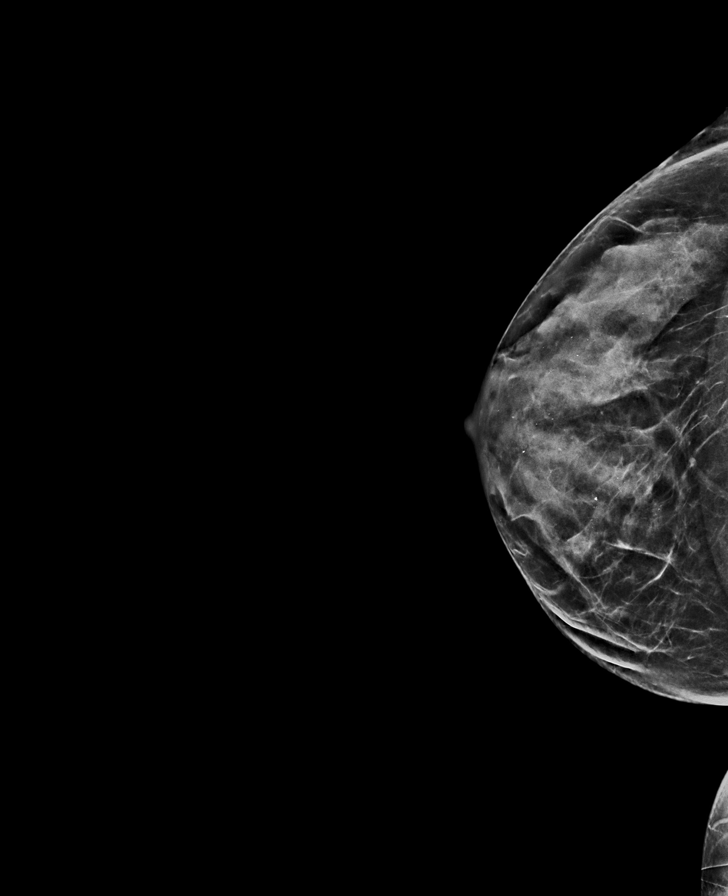

[R MLO synth-2D]
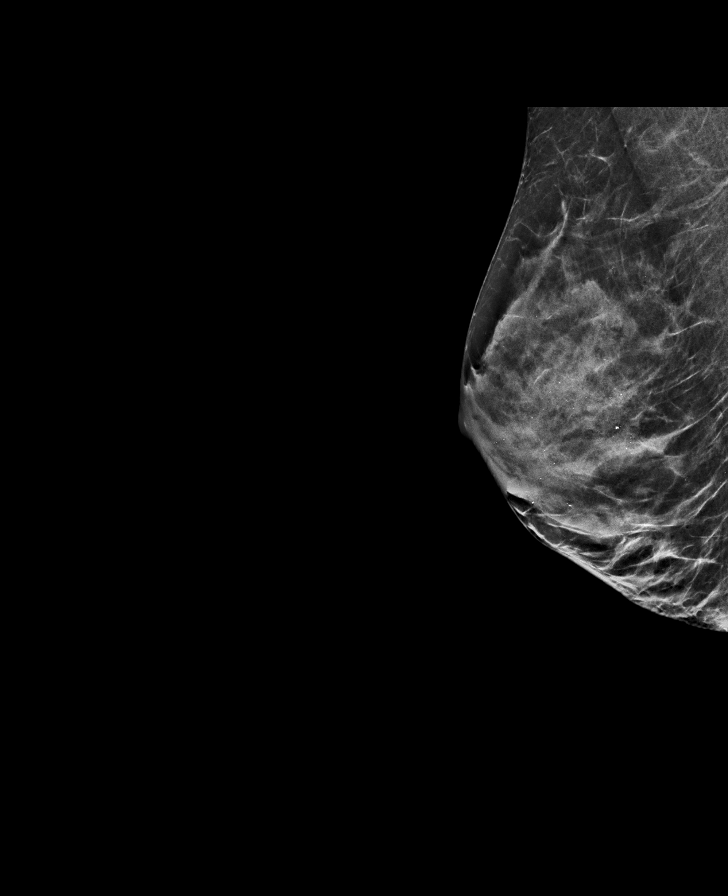

[R ML synth-2D]
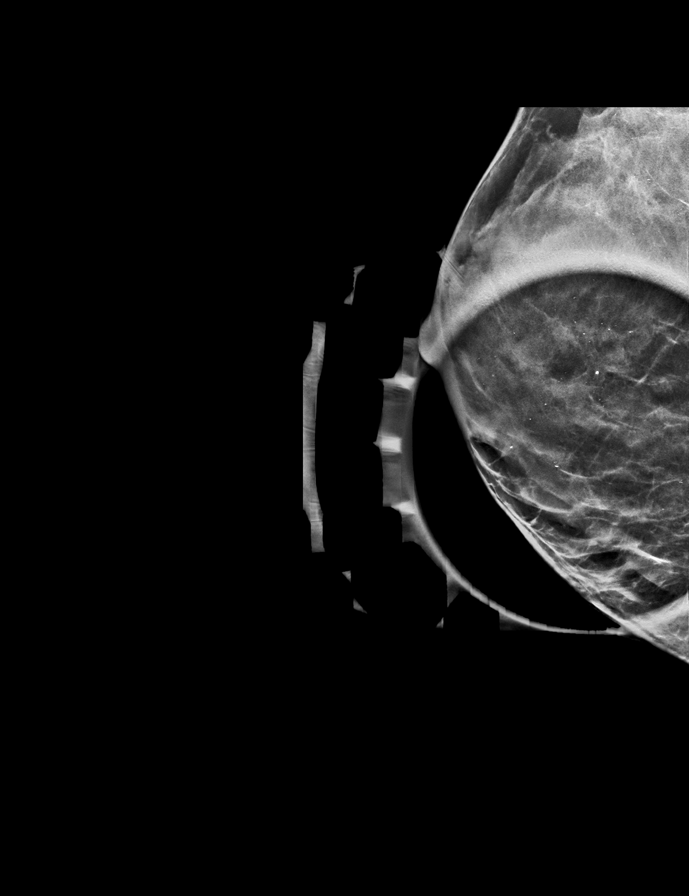

[R CC synth-2D (2 of 2)]
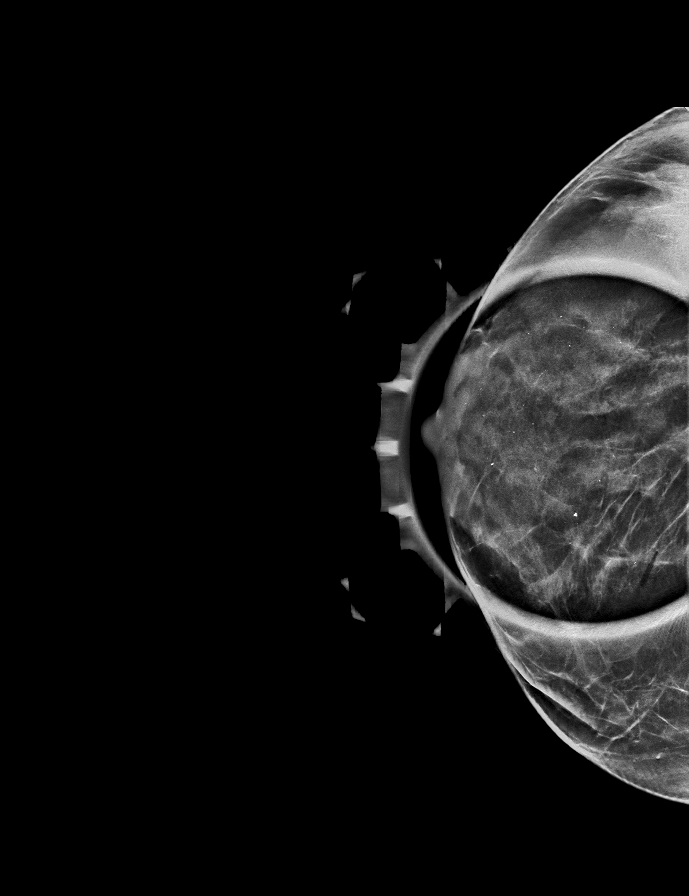

[R CC tomo (1 of 2) · tomo slice 26/51.0]
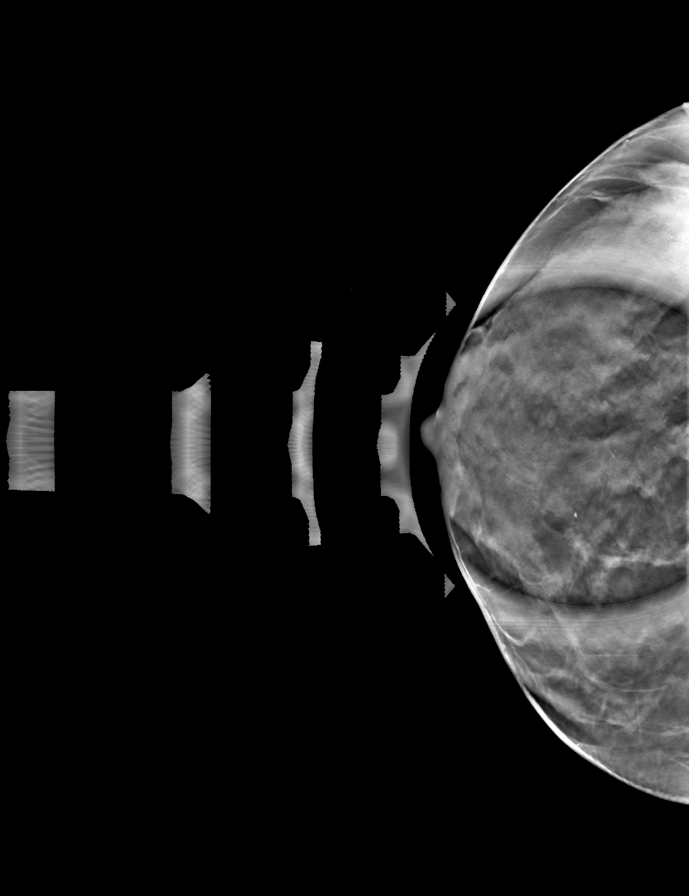

[R MLO tomo · tomo slice 27/54.0]
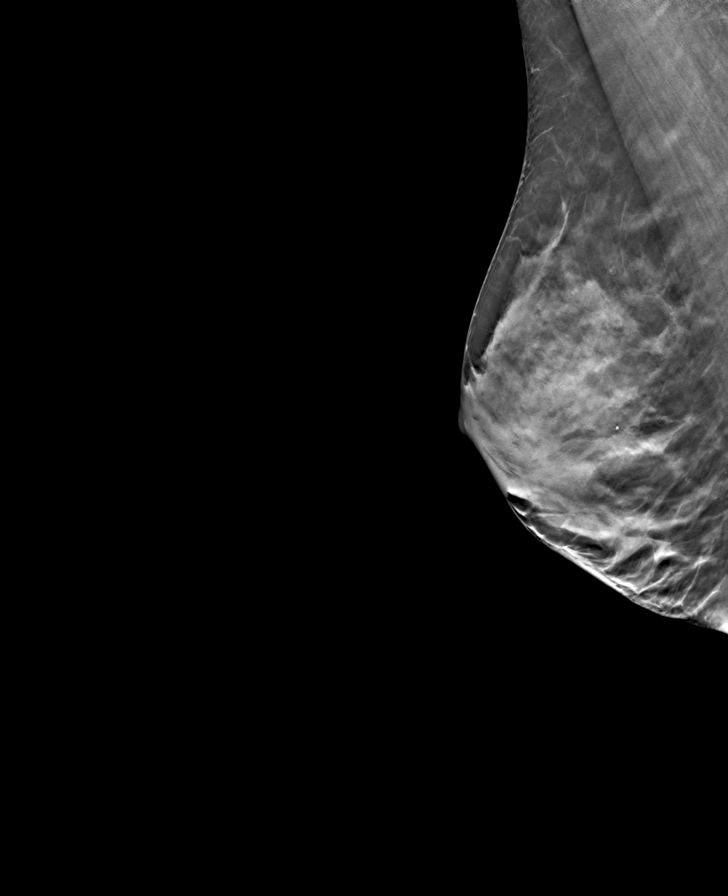

[R CC tomo (2 of 2) · tomo slice 29/57.0]
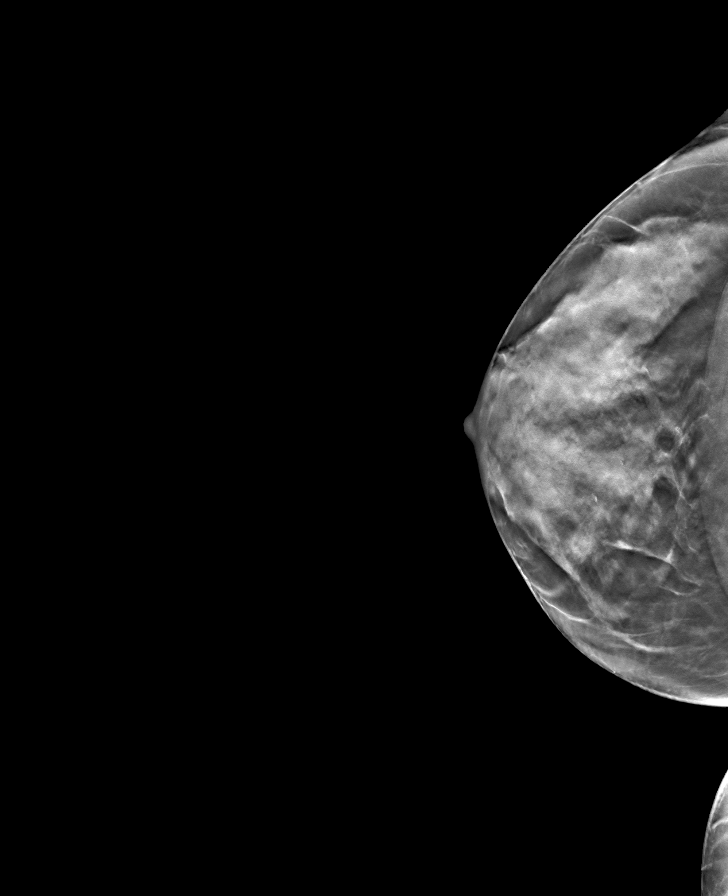

[R ML tomo · tomo slice 27/52.0]
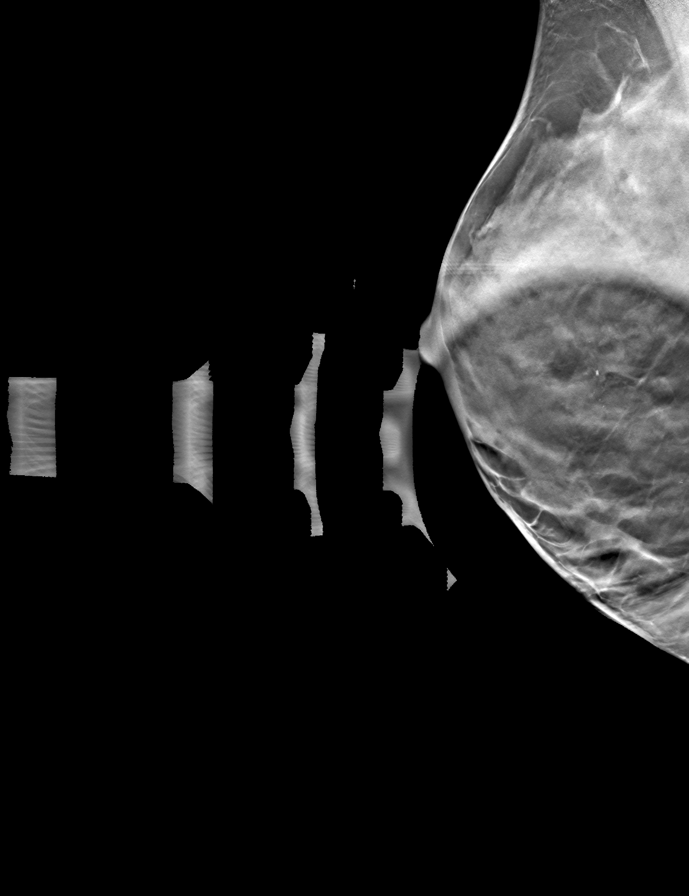

[8 of 24 positions shown; findings below may reference images not displayed]

ACR Breast Density Category c: The breast tissue is heterogeneously
dense, which may obscure small masses.
FINDINGS: Additional images of the right breast demonstrate questionable
distortion over the anterior third of the lower central right
breast. There is no sonographic abnormality to correspond to this
questionable distortion at the time of diagnostic workup. This is
likely represents peri-cystic fibrosis and not true distortion.
IMPRESSION: Questionable distortion over the anterior third of the lower central
right breast.

RECOMMENDATION:
Recommend six-month follow-up diagnostic right breast mammogram with
3D imaging to document stability.

I have discussed the findings and recommendations with the patient.
If applicable, a reminder letter will be sent to the patient
regarding the next appointment.

BI-RADS CATEGORY  3: Probably benign.

## 2022-08-26 ENCOUNTER — Ambulatory Visit: Payer: BC Managed Care – PPO

## 2022-08-26 DIAGNOSIS — K640 First degree hemorrhoids: Secondary | ICD-10-CM

## 2022-08-26 DIAGNOSIS — Z1211 Encounter for screening for malignant neoplasm of colon: Secondary | ICD-10-CM | POA: Diagnosis not present

## 2022-12-12 ENCOUNTER — Other Ambulatory Visit: Payer: Self-pay | Admitting: General Surgery

## 2022-12-12 DIAGNOSIS — N6489 Other specified disorders of breast: Secondary | ICD-10-CM

## 2023-02-16 ENCOUNTER — Ambulatory Visit
Admission: RE | Admit: 2023-02-16 | Discharge: 2023-02-16 | Disposition: A | Discharge status: Home / Self Care | Payer: No Typology Code available for payment source | Source: Ambulatory Visit | Attending: General Surgery | Admitting: General Surgery

## 2023-02-16 ENCOUNTER — Ambulatory Visit
Admission: RE | Admit: 2023-02-16 | Discharge: 2023-02-16 | Disposition: A | Discharge status: Home / Self Care | Payer: No Typology Code available for payment source | Source: Ambulatory Visit | Attending: General Surgery

## 2023-02-16 DIAGNOSIS — N6489 Other specified disorders of breast: Secondary | ICD-10-CM

## 2023-12-14 ENCOUNTER — Other Ambulatory Visit: Payer: Self-pay | Admitting: General Surgery

## 2023-12-14 DIAGNOSIS — R928 Other abnormal and inconclusive findings on diagnostic imaging of breast: Secondary | ICD-10-CM

## 2023-12-14 DIAGNOSIS — Z1231 Encounter for screening mammogram for malignant neoplasm of breast: Secondary | ICD-10-CM

## 2024-02-17 ENCOUNTER — Encounter

## 2024-02-26 ENCOUNTER — Inpatient Hospital Stay: Admission: RE | Admit: 2024-02-26 | Source: Ambulatory Visit

## 2024-02-26 DIAGNOSIS — Z1231 Encounter for screening mammogram for malignant neoplasm of breast: Secondary | ICD-10-CM

## 2024-02-26 DIAGNOSIS — R928 Other abnormal and inconclusive findings on diagnostic imaging of breast: Secondary | ICD-10-CM
